# Patient Record
Sex: Male | Born: 1995 | Race: Black or African American | Hispanic: No | Marital: Single | Smoking: Current every day smoker
Health system: Southern US, Community
[De-identification: ages and names within clinical notes are randomized; demographics above are authoritative.]

## PROBLEM LIST (undated history)

## (undated) HISTORY — PX: OTHER SURGICAL HISTORY: SHX169

## (undated) HISTORY — PX: TONSILLECTOMY: SUR1361

---
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
## 2016-01-11 ENCOUNTER — Emergency Department (HOSPITAL_COMMUNITY)
Admission: EM | Admit: 2016-01-11 | Discharge: 2016-01-11 | Disposition: A | Discharge status: Home / Self Care | Payer: Self-pay | Attending: Emergency Medicine | Admitting: Emergency Medicine

## 2016-01-11 ENCOUNTER — Encounter (HOSPITAL_COMMUNITY): Payer: Self-pay | Admitting: Emergency Medicine

## 2016-01-11 ENCOUNTER — Emergency Department (HOSPITAL_COMMUNITY): Payer: Self-pay

## 2016-01-11 DIAGNOSIS — Y999 Unspecified external cause status: Secondary | ICD-10-CM | POA: Insufficient documentation

## 2016-01-11 DIAGNOSIS — Z79899 Other long term (current) drug therapy: Secondary | ICD-10-CM | POA: Insufficient documentation

## 2016-01-11 DIAGNOSIS — F17210 Nicotine dependence, cigarettes, uncomplicated: Secondary | ICD-10-CM | POA: Insufficient documentation

## 2016-01-11 DIAGNOSIS — S41002A Unspecified open wound of left shoulder, initial encounter: Secondary | ICD-10-CM | POA: Insufficient documentation

## 2016-01-11 DIAGNOSIS — Y939 Activity, unspecified: Secondary | ICD-10-CM | POA: Insufficient documentation

## 2016-01-11 DIAGNOSIS — Z23 Encounter for immunization: Secondary | ICD-10-CM | POA: Insufficient documentation

## 2016-01-11 DIAGNOSIS — R791 Abnormal coagulation profile: Secondary | ICD-10-CM | POA: Insufficient documentation

## 2016-01-11 DIAGNOSIS — Y929 Unspecified place or not applicable: Secondary | ICD-10-CM | POA: Insufficient documentation

## 2016-01-11 DIAGNOSIS — W3400XA Accidental discharge from unspecified firearms or gun, initial encounter: Secondary | ICD-10-CM

## 2016-01-11 LAB — I-STAT CHEM 8, ED
BUN: 11 mg/dL (ref 6–20)
CALCIUM ION: 1.18 mmol/L (ref 1.15–1.40)
CHLORIDE: 100 mmol/L — AB (ref 101–111)
CREATININE: 1.2 mg/dL (ref 0.61–1.24)
GLUCOSE: 111 mg/dL — AB (ref 65–99)
HCT: 45 % (ref 39.0–52.0)
Hemoglobin: 15.3 g/dL (ref 13.0–17.0)
POTASSIUM: 3.7 mmol/L (ref 3.5–5.1)
Sodium: 141 mmol/L (ref 135–145)
TCO2: 27 mmol/L (ref 0–100)

## 2016-01-11 LAB — PROTIME-INR
INR: 1.06
PROTHROMBIN TIME: 13.8 s (ref 11.4–15.2)

## 2016-01-11 LAB — ETHANOL: ALCOHOL ETHYL (B): 11 mg/dL — AB (ref <5)

## 2016-01-11 LAB — SAMPLE TO BLOOD BANK

## 2016-01-11 LAB — COMPREHENSIVE METABOLIC PANEL
ALK PHOS: 71 U/L (ref 38–126)
ALT: 24 U/L (ref 17–63)
AST: 25 U/L (ref 15–41)
Albumin: 4.5 g/dL (ref 3.5–5.0)
Anion gap: 9 (ref 5–15)
BILIRUBIN TOTAL: 0.8 mg/dL (ref 0.3–1.2)
BUN: 9 mg/dL (ref 6–20)
CALCIUM: 9.7 mg/dL (ref 8.9–10.3)
CO2: 28 mmol/L (ref 22–32)
CREATININE: 1.18 mg/dL (ref 0.61–1.24)
Chloride: 103 mmol/L (ref 101–111)
GFR calc Af Amer: >60 mL/min (ref >60)
Glucose, Bld: 110 mg/dL — ABNORMAL HIGH (ref 65–99)
Potassium: 3.7 mmol/L (ref 3.5–5.1)
Sodium: 140 mmol/L (ref 135–145)
TOTAL PROTEIN: 7.7 g/dL (ref 6.5–8.1)

## 2016-01-11 LAB — CBC
HCT: 45.4 % (ref 39.0–52.0)
Hemoglobin: 15.7 g/dL (ref 13.0–17.0)
MCH: 28.3 pg (ref 26.0–34.0)
MCHC: 34.6 g/dL (ref 30.0–36.0)
MCV: 81.9 fL (ref 78.0–100.0)
PLATELETS: 356 10*3/uL (ref 150–400)
RBC: 5.54 MIL/uL (ref 4.22–5.81)
RDW: 13.8 % (ref 11.5–15.5)
WBC: 18.1 10*3/uL — AB (ref 4.0–10.5)

## 2016-01-11 LAB — I-STAT CG4 LACTIC ACID, ED: Lactic Acid, Venous: 1.67 mmol/L (ref 0.5–1.9)

## 2016-01-11 LAB — CDS SEROLOGY

## 2016-01-11 MED ORDER — TETANUS-DIPHTH-ACELL PERTUSSIS 5-2.5-18.5 LF-MCG/0.5 IM SUSP
0.5000 mL | Freq: Once | INTRAMUSCULAR | Status: AC
  Administered 2016-01-11: 0.5 mL via INTRAMUSCULAR

## 2016-01-11 MED ORDER — TRAMADOL HCL 50 MG PO TABS: 50.0000 mg | ORAL_TABLET | Freq: Four times a day (QID) | ORAL | 0 refills | Status: AC | PRN

## 2016-01-11 MED ORDER — SODIUM CHLORIDE 0.9 % IV BOLUS (SEPSIS)
1000.0000 mL | Freq: Once | INTRAVENOUS | Status: AC
  Administered 2016-01-11: 1000 mL via INTRAVENOUS

## 2016-01-11 MED ORDER — TETANUS-DIPHTH-ACELL PERTUSSIS 5-2.5-18.5 LF-MCG/0.5 IM SUSP
INTRAMUSCULAR | Status: AC
  Filled 2016-01-11: qty 0.5
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
## 2020-01-12 ENCOUNTER — Ambulatory Visit: Payer: Self-pay | Attending: Internal Medicine

## 2020-01-12 DIAGNOSIS — Z23 Encounter for immunization: Secondary | ICD-10-CM

## 2020-02-04 ENCOUNTER — Ambulatory Visit: Payer: Self-pay

## 2021-05-21 ENCOUNTER — Ambulatory Visit (HOSPITAL_COMMUNITY)
Admission: EM | Admit: 2021-05-21 | Discharge: 2021-05-21 | Disposition: A | Discharge status: Home / Self Care | Payer: 59 | Attending: Emergency Medicine | Admitting: Emergency Medicine

## 2021-05-21 ENCOUNTER — Encounter (HOSPITAL_COMMUNITY): Payer: Self-pay

## 2021-05-21 DIAGNOSIS — K047 Periapical abscess without sinus: Secondary | ICD-10-CM | POA: Diagnosis not present

## 2021-05-21 MED ORDER — IBUPROFEN 800 MG PO TABS: 800.0000 mg | ORAL_TABLET | Freq: Three times a day (TID) | ORAL | 0 refills | Status: AC

## 2021-05-21 MED ORDER — PENICILLIN V POTASSIUM 500 MG PO TABS: 500.0000 mg | ORAL_TABLET | Freq: Four times a day (QID) | ORAL | 0 refills | Status: AC

## 2021-05-21 NOTE — ED Triage Notes (Signed)
Pt states gum swelling for the past three days. Pt states its painful and achy.  ?

## 2021-05-21 NOTE — Discharge Instructions (Signed)
Take antibiotic and Motrin or Tylenol for pain as needed follow-up with dentist as soon as possible salt water swishes would help return at anytime for new or worsening symptoms for reevaluation. ?

## 2021-05-21 NOTE — ED Provider Notes (Signed)
?[facility] ? ? ?MRN: [id number] DOB: Jun 15, 1995 ? ?Subjective:  ? ?Chief Complaint;  ?Chief Complaint  ?Patient presents with  ? Oral Swelling  ? ?Pt states gum swelling for the past three days. Pt states its painful and achy.  ? ?Christian Everett is a 26 y.o. male presenting for toothache and swelling to the left lower gums for the past 3 days.  He denies fever or history of tooth issues. ? ?No current facility-administered medications for this encounter. ? ?Current Outpatient Medications:  ?  ibuprofen (ADVIL) 800 MG tablet, Take 1 tablet (800 mg total) by mouth 3 (three) times daily., Disp: 21 tablet, Rfl: 0 ?  penicillin v potassium (VEETID) 500 MG tablet, Take 1 tablet (500 mg total) by mouth 4 (four) times daily for 7 days., Disp: 28 tablet, Rfl: 0 ?  famotidine (PEPCID) 20 MG tablet, Take 1 tablet (20 mg total) by mouth 2 (two) times daily. (Patient not taking: Reported on 01/11/2016), Disp: 30 tablet, Rfl: 0 ?  permethrin (ELIMITE) 5 % cream, Apply from the neck down at night and wash off in the morning x 1 application. May repeat in one week if symptoms persist. (Patient not taking: Reported on 01/11/2016), Disp: 60 g, Rfl: 1 ?  traMADol (ULTRAM) 50 MG tablet, Take 1 tablet (50 mg total) by mouth every 6 (six) hours as needed., Disp: 10 tablet, Rfl: 0  ? ?No Known Allergies ? ?History reviewed. No pertinent past medical history.  ? ?Review of Systems  ?All other systems reviewed and are negative. ? ? ?Objective:  ? ?Vitals: ?BP (!) 146/81 (BP Location: Left Arm)   Pulse 96   Temp 99.4 ?F (37.4 ?C) (Oral)   Resp 18   SpO2 98%  ? ?Physical Exam ?Constitutional:   ?   Appearance: Normal appearance.  ?HENT:  ?   Head: Normocephalic.  ?   Mouth/Throat:  ?   Mouth: Mucous membranes are moist.  ?   Comments: First molar with gumline mass., likely abscess- non fluctuant + tenderness + ttp to tooth- teeth in overall good repair no gross gingivitis ?Cardiovascular:  ?   Rate and Rhythm:  Normal rate.  ?Pulmonary:  ?   Effort: Pulmonary effort is normal.  ?Skin: ?   General: Skin is warm and dry.  ?Neurological:  ?   Mental Status: He is alert and oriented to person, place, and time.  ?Psychiatric:     ?   Mood and Affect: Mood normal.  ? ? ?No results found for this or any previous visit (from the past 24 hour(s)). ? ?No results found.  ?  ? ?Assessment and Plan :  ? ?1. Dental abscess   ? ? ?Meds ordered this encounter  ?Medications  ? penicillin v potassium (VEETID) 500 MG tablet  ?  Sig: Take 1 tablet (500 mg total) by mouth 4 (four) times daily for 7 days.  ?  Dispense:  28 tablet  ?  Refill:  0  ? ibuprofen (ADVIL) 800 MG tablet  ?  Sig: Take 1 tablet (800 mg total) by mouth 3 (three) times daily.  ?  Dispense:  21 tablet  ?  Refill:  0  ? ? ?MDM:  ?Christian Everett is a 26 y.o. male presenting for gumline pain and swelling.  He has associated tooth pain but no fevers.  He has been taking Tylenol and aspirin for the pain which has not helped.  On exam there is  obvious swelling at the gumline that is nonfluctuant consistent with a dental abscess.  I ordered antibiotic and ibuprofen tablets for the patient he is encouraged to follow-up with a dentist.  I discussed treatment, follow up and return instructions. Questions were answered. Patient stated understanding of instructions and is stable for discharge. ? ?Christian Conger FNP-C MCN  ?  ?Christian Baseman, NP ?05/21/21 1952 ? ?

## 2021-12-15 ENCOUNTER — Encounter (HOSPITAL_COMMUNITY): Payer: Self-pay
